# Patient Record
Sex: Male | Born: 1980
Health system: Southern US, Community
[De-identification: ages and names within clinical notes are randomized; demographics above are authoritative.]

## PROBLEM LIST (undated history)

## (undated) DIAGNOSIS — Z789 Other specified health status: Secondary | ICD-10-CM

## (undated) HISTORY — DX: Other specified health status: Z78.9

---
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
## 2018-12-15 DIAGNOSIS — Z119 Encounter for screening for infectious and parasitic diseases, unspecified: Secondary | ICD-10-CM | POA: Diagnosis not present

## 2018-12-15 DIAGNOSIS — Z3141 Encounter for fertility testing: Secondary | ICD-10-CM | POA: Diagnosis not present

## 2019-06-11 DIAGNOSIS — B349 Viral infection, unspecified: Secondary | ICD-10-CM | POA: Diagnosis not present

## 2019-06-11 DIAGNOSIS — Z20828 Contact with and (suspected) exposure to other viral communicable diseases: Secondary | ICD-10-CM | POA: Diagnosis not present

## 2019-06-19 DIAGNOSIS — B9689 Other specified bacterial agents as the cause of diseases classified elsewhere: Secondary | ICD-10-CM | POA: Diagnosis not present

## 2019-06-19 DIAGNOSIS — J208 Acute bronchitis due to other specified organisms: Secondary | ICD-10-CM | POA: Diagnosis not present

## 2019-06-19 DIAGNOSIS — Z7189 Other specified counseling: Secondary | ICD-10-CM | POA: Diagnosis not present

## 2019-06-23 ENCOUNTER — Telehealth: Payer: Self-pay

## 2019-06-23 NOTE — Telephone Encounter (Signed)
It's 21 days from when his symptoms first started so should be coming up on that date  w/in  a week and fine to schedule him At that point but not sooner per ID recs Ninetta Lights)

## 2019-06-23 NOTE — Telephone Encounter (Signed)
LMTCB

## 2019-06-23 NOTE — Telephone Encounter (Signed)
Message routed to [facility],  I called the patient back and he stated he relocated here from [place] and does not have an PCP yet. He wanted to be seen by our office to make sure that his lungs are ok since his covid diagnosis.  I did make him aware that per Dr. Sherene Sires, he would not be able to be seen until 21 days after his diagnosis. The patient voiced understanding of this. I tried to set him up with Dr. Sherene Sires, but he did not have anything available until February 2nd. The patient asked if he can be scheduled to be seen by one of the other doctors here. I told him that could be done, but to keep in mind that our doctors are also providing care for the covid patients so appointments may be limited but we will do what we can to get him scheduled. I asked him to contact his PCP in [place] to request his recent notes, labs, etc are faxed to our office for the upcoming appointment. Patient given our fax number and for it to be sent to the attention of "Patrice", he voiced understanding.  Patrice, how far out are the consults being scheduled?

## 2019-06-23 NOTE — Telephone Encounter (Signed)
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

## 2019-06-28 NOTE — Telephone Encounter (Signed)
Consult for feb 2nd wasn't made. Called and spoke to pt. First available is Feb 3rd with Dr. Sherene Sires. Consult appt made for pt. Pt aware of address. Nothing further needed at this time. Will sign off.

## 2019-07-21 ENCOUNTER — Ambulatory Visit: Payer: BC Managed Care – PPO | Admitting: Internal Medicine

## 2019-07-21 ENCOUNTER — Encounter: Payer: Self-pay | Admitting: Internal Medicine

## 2019-07-21 ENCOUNTER — Other Ambulatory Visit: Payer: Self-pay

## 2019-07-21 ENCOUNTER — Ambulatory Visit (INDEPENDENT_AMBULATORY_CARE_PROVIDER_SITE_OTHER): Payer: BC Managed Care – PPO

## 2019-07-21 DIAGNOSIS — U071 COVID-19: Secondary | ICD-10-CM

## 2019-07-21 DIAGNOSIS — J4521 Mild intermittent asthma with (acute) exacerbation: Secondary | ICD-10-CM | POA: Diagnosis not present

## 2019-07-21 DIAGNOSIS — R05 Cough: Secondary | ICD-10-CM | POA: Diagnosis not present

## 2019-07-21 MED ORDER — TRAMADOL HCL 50 MG PO TABS: 50.0000 mg | ORAL_TABLET | ORAL | 0 refills | Status: AC | PRN

## 2019-07-21 MED ORDER — BUDESONIDE-FORMOTEROL FUMARATE 80-4.5 MCG/ACT IN AERO: INHALATION_SPRAY | RESPIRATORY_TRACT | 12 refills | Status: DC

## 2019-07-21 NOTE — Progress Notes (Addendum)
David Keller, male    DOB: 06-25-1980, 39 y.o.   MRN: [id number]   Brief patient profile:  38 yobm never smoker / pace maker rep  asthma as child and used saba mostly prn with ex and for springtime flares assoc with rhinitis with allergy w/u "only positive for shellfish"and no maint rx and no limits then acute onset Dec 23rd= chills then fever then aches then cough and sob with Pos covid testing rx zpak / prednisone/ saba  And all better x for the cough so self referred to pulmonary clinic 07/21/2019      History of Present Illness  07/21/2019  Pulmonary/ 1st office eval/David Keller  Chief Complaint  Patient presents with  . Pulmonary Consult    Self referral- had covid dx 06/09/2019- since then dry cough, esp worse at night. He has also noticed some wheezing.  He is using an albuterol inhaler about 2 x per wk.   Dyspnea:  MMRC1 = can walk nl pace, flat grade, can't hurry or go uphills or steps s sob   Cough: worse p gets home in evening, never productive / no assoc rhinitis symtoms and worse with laughing, can be severe to point of chest discomfort from coughing fits  Sleep: settles down hs and able to lie flat SABA use: last used 3 days and helps but makes him too shaky to use regulalry   No obvious day to day or daytime variability or assoc excess/ purulent sputum or mucus plugs or hemoptysis or   chest tightness, subjective wheeze or overt sinus or hb symptoms.   Sleeping ok  without nocturnal  or early am exacerbation  of respiratory  c/o's or need for noct saba. Also denies any obvious fluctuation of symptoms with weather or environmental changes or other aggravating or alleviating factors except as outlined above   No unusual exposure hx or h/o childhood pna  or knowledge of premature birth.  Current Allergies, Complete Past Medical History, Past Surgical History, Family History, and Social History were reviewed in David Keller record.  ROS  The following are not  active complaints unless bolded Hoarseness, sore throat, dysphagia, dental problems, itching, sneezing,  nasal congestion or discharge of excess mucus or purulent secretions, ear ache,   fever, chills, sweats, unintended wt loss or wt gain, classically pleuritic or exertional cp,  orthopnea pnd or arm/hand swelling  or leg swelling, presyncope, palpitations, abdominal pain, anorexia, nausea, vomiting, diarrhea  or change in bowel habits or change in bladder habits, change in stools or change in urine, dysuria, hematuria,  rash, arthralgias, visual complaints, headache, numbness, weakness or ataxia or problems with walking or coordination,  change in mood or  memory.             No past medical history on file.  Outpatient Medications Prior to Visit  Medication Sig Dispense Refill  . albuterol (VENTOLIN HFA) 108 (90 Base) MCG/ACT inhaler Inhale 2 puffs into the lungs every 6 (six) hours as needed for wheezing or shortness of breath.    . Multiple Vitamin (MULTIVITAMIN) tablet Take 1 tablet by mouth daily.           Objective:     BP 118/76 (BP Location: Left Arm, Cuff Size: Normal)   Pulse 73   Temp (!) 97.2 F (36.2 C) (Temporal)   Ht 5\' 8"  (1.727 m)   Wt 187 lb (84.8 kg)   SpO2 100%   BMI 28.43 kg/m   amb bm nad  on RA   HEENT : pt wearing mask not removed for exam due to covid -19 concerns.    NECK :  without JVD/Nodes/TM/ nl carotid upstrokes bilaterally   LUNGS: no acc muscle use,  Nl contour chest which is clear to A and P bilaterally with cough  @ end exp maneuvers   CV:  RRR  no s3 or murmur or increase in P2, and no edema   ABD:  soft and nontender with nl inspiratory excursion in the supine position. No bruits or organomegaly appreciated, bowel sounds nl  MS:  Nl gait/ ext warm without deformities, calf tenderness, cyanosis or clubbing No obvious joint restrictions   SKIN: warm and dry without lesions    NEURO:  alert, approp, nl sensorium with  no motor or  cerebellar deficits apparent.     CXR PA and Lateral:   07/21/2019 :    I personally reviewed images and   impression as follows:    mild increase marking non-specific s convincing as dz     Assessment   COVID-19 virus infection Onset of symptoms Jun 09 2019 with persistent cough  - cyclical cough rx 07/19/1939   Of the three most common causes of  Sub-acute / recurrent or chronic cough, only one (GERD)  can actually contribute to/ trigger  the other two (asthma and post nasal drip syndrome)  and perpetuate the cylce of cough.  While not intuitively obvious, many patients with chronic low grade reflux do not cough until there is a primary insult that disturbs the protective epithelial barrier and exposes sensitive nerve endings.   This is typically viral but can due to PNDS and  either may apply here.     >>>The point is that once this occurs, it is difficult to eliminate the cycle  using anything but a maximally effective acid suppression regimen at least in the short run, accompanied by an appropriate diet to address non acid GERD and control / eliminate the cough itself for at least 3 days with tramadol and regroup in 2 weeks if not 100% better      Mild intermittent asthmatic bronchitis with exacerbation Onset in childhood with allergy testing only pos for shellfish - 07/21/2019  After extensive coaching inhaler device,  effectiveness =    75%    Cough at end exp is typical of cough from asthma but can't tol saba or pred well and is very mild so will just try symb 80 1-2 bid prn Based on two studies from NEJM  378; 20 p 1865 (2018) and 380 : p2020-30 (2019) in pts with mild asthma it is reasonable to use low dose symbicort eg 80 2bid "prn" flare in this setting but I emphasized this was only shown with symbicort and takes advantage of the rapid onset of action but is not the same as "rescue therapy" but can be stopped once the acute symptoms have resolved and the need for rescue has been  minimized (< 2 x weekly)     Pulmonary f/u is prn    Total time devoted to counseling  > 50 % of initial 45 min office visit:  review case with pt/I performed device teaching  using a teach back technique which also  extended face to face time for this visit (see above) discussion of options/alternatives/ personally creating written customized instructions  in presence of pt  then going over those specific  Instructions directly with the pt including how to use all of the  meds but in particular covering each new medication in detail and the difference between the maintenance= "automatic" meds and the prns using an action plan format for the latter (If this problem/symptom => do that organization reading Left to right).  Please see AVS from this visit for a full list of these instructions which I personally wrote for this pt and  are unique to this visit.   Sandrea Hughs, MD 07/21/2019

## 2019-07-21 NOTE — Patient Instructions (Addendum)
Symbicort 80 one twice daily   Work on inhaler technique:  relax and gently blow all the way out then take a nice smooth deep breath back in, triggering the inhaler at same time you start breathing in.  Hold for up to 5 seconds if you can. Blow out thru nose. Rinse and gargle with water when done  Only use your albuterol as a rescue medication to be used if you can't catch your breath by resting or doing a relaxed purse lip breathing pattern.  - The less you use it, the better it will work when you need it. - Ok to use up to 1- 2 puffs  every 4 hours if you must but call for immediate appointment if use goes up over your usual need - Don't leave home without it !!  (think of it like the spare tire for your car)   Try prilosec otc 20mg   Take 30- 60 min before your first and last meals of the day until cough is gone for a week     GERD (REFLUX)  is an extremely common cause of respiratory symptoms just like yours , many times with no obvious heartburn at all.    It can be treated with medication, but also with lifestyle changes including elevation of the head of your bed (ideally with 6 -8inch blocks under the headboard of your bed),  Smoking cessation, avoidance of late meals, excessive alcohol, and avoid fatty foods, chocolate, peppermint, colas, red wine, and acidic juices such as orange juice.  NO MINT OR MENTHOL PRODUCTS SO NO COUGH DROPS  USE SUGARLESS CANDY INSTEAD (Jolley ranchers or Stover's or Life Savers) or even ice chips will also do - the key is to swallow to prevent all throat clearing. NO OIL BASED VITAMINS - use powdered substitutes.  Avoid fish oil when coughing.    Take delsym two tsp every 12 hours and supplement if needed with  tramadol 50 mg up to 1 every 4 hours to suppress the urge to cough. Swallowing water and/or using ice chips/non mint and menthol containing candies (such as lifesavers or sugarless jolly ranchers) are also effective.  You should rest your voice and avoid  activities that you know make you cough.  Once you have eliminated the cough for 3 straight days try reducing the tramadol first,  then the delsym as tolerated.       Please remember to go to the  x-ray department  for your tests - we will call you with the results when they are available     Call if not all better in 2 weeks

## 2019-07-22 ENCOUNTER — Encounter: Payer: Self-pay | Admitting: Internal Medicine

## 2019-07-22 DIAGNOSIS — J4521 Mild intermittent asthma with (acute) exacerbation: Secondary | ICD-10-CM | POA: Insufficient documentation

## 2019-07-22 NOTE — Progress Notes (Signed)
Called and left detailed msg on machine with results

## 2019-07-22 NOTE — Assessment & Plan Note (Signed)
Onset of symptoms Jun 09 2019 with persistent cough  - cyclical cough rx 07/21/2019   Of the three most common causes of  Sub-acute / recurrent or chronic cough, only one (GERD)  can actually contribute to/ trigger  the other two (asthma and post nasal drip syndrome)  and perpetuate the cylce of cough.  While not intuitively obvious, many patients with chronic low grade reflux do not cough until there is a primary insult that disturbs the protective epithelial barrier and exposes sensitive nerve endings.   This is typically viral but can due to PNDS and  either may apply here.     >>>The point is that once this occurs, it is difficult to eliminate the cycle  using anything but a maximally effective acid suppression regimen at least in the short run, accompanied by an appropriate diet to address non acid GERD and control / eliminate the cough itself for at least 3 days with tramadol and regroup in 2 weeks if not 100% better

## 2019-07-22 NOTE — Assessment & Plan Note (Signed)
Onset in childhood with allergy testing only pos for shellfish - 07/21/2019  After extensive coaching inhaler device,  effectiveness =    75%    Cough at end exp is typical of cough from asthma but can't tol saba or pred well and is very mild so will just try symb 80 1-2 bid prn Based on two studies from NEJM  378; 20 p 1865 (2018) and 380 : p2020-30 (2019) in pts with mild asthma it is reasonable to use low dose symbicort eg 80 2bid "prn" flare in this setting but I emphasized this was only shown with symbicort and takes advantage of the rapid onset of action but is not the same as "rescue therapy" but can be stopped once the acute symptoms have resolved and the need for rescue has been minimized (< 2 x weekly)     Pulmonary f/u is prn    Total time devoted to counseling  > 50 % of initial 45 min office visit:  review case with pt/I performed device teaching  using a teach back technique which also  extended face to face time for this visit (see above) discussion of options/alternatives/ personally creating written customized instructions  in presence of pt  then going over those specific  Instructions directly with the pt including how to use all of the meds but in particular covering each new medication in detail and the difference between the maintenance= "automatic" meds and the prns using an action plan format for the latter (If this problem/symptom => do that organization reading Left to right).  Please see AVS from this visit for a full list of these instructions which I personally wrote for this pt and  are unique to this visit.

## 2019-12-02 DIAGNOSIS — Z13220 Encounter for screening for lipoid disorders: Secondary | ICD-10-CM | POA: Diagnosis not present

## 2019-12-02 DIAGNOSIS — Z0000 Encounter for general adult medical examination without abnormal findings: Secondary | ICD-10-CM | POA: Diagnosis not present

## 2021-09-04 IMAGING — DX DG CHEST 2V
2 series · 2 of 2 positions shown · non-contrast
Comparison: None.

CLINICAL DATA: Cough.  Recent KNO2A-VY virus infection.

EXAM:
CHEST - 2 VIEW

[chest pa]
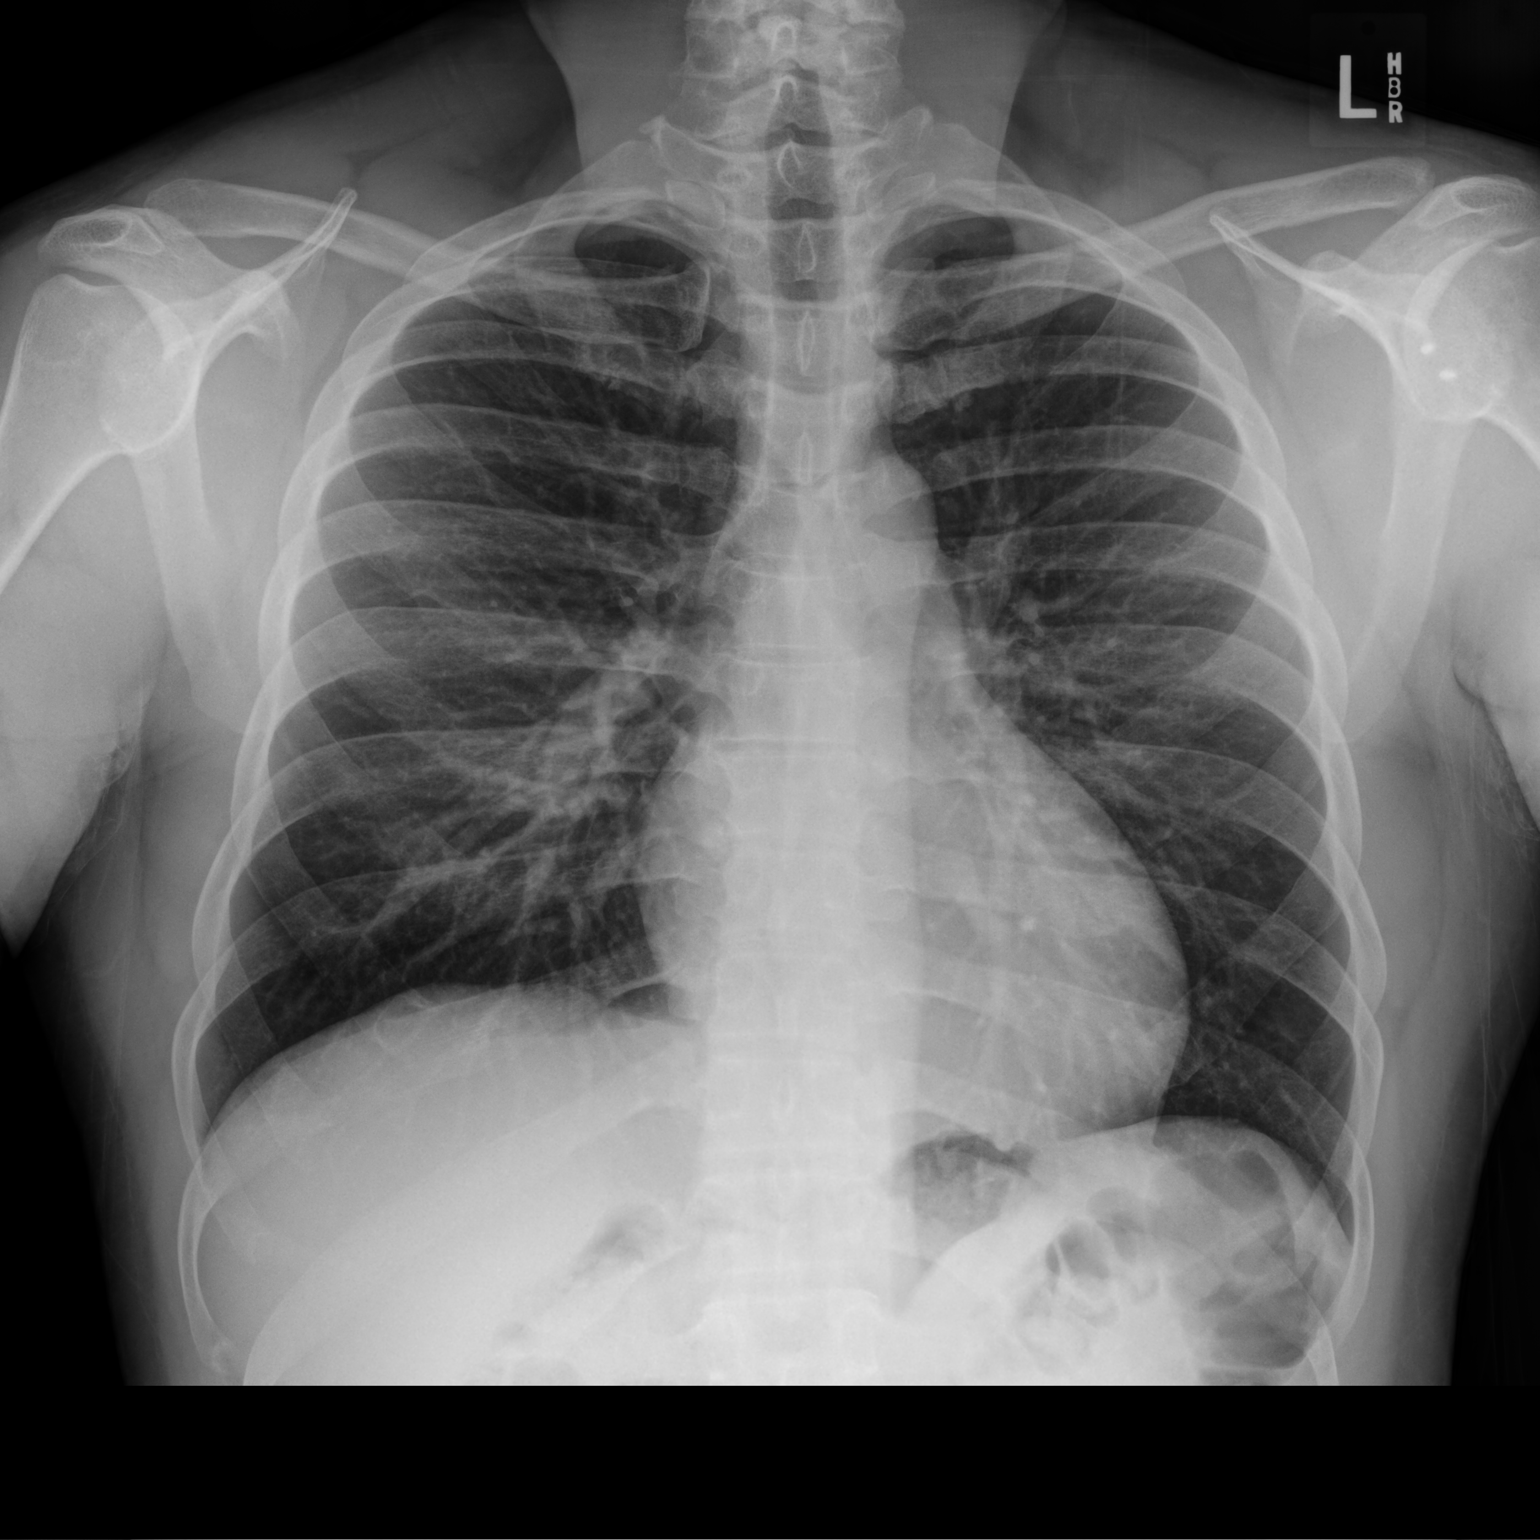

[chest lat]
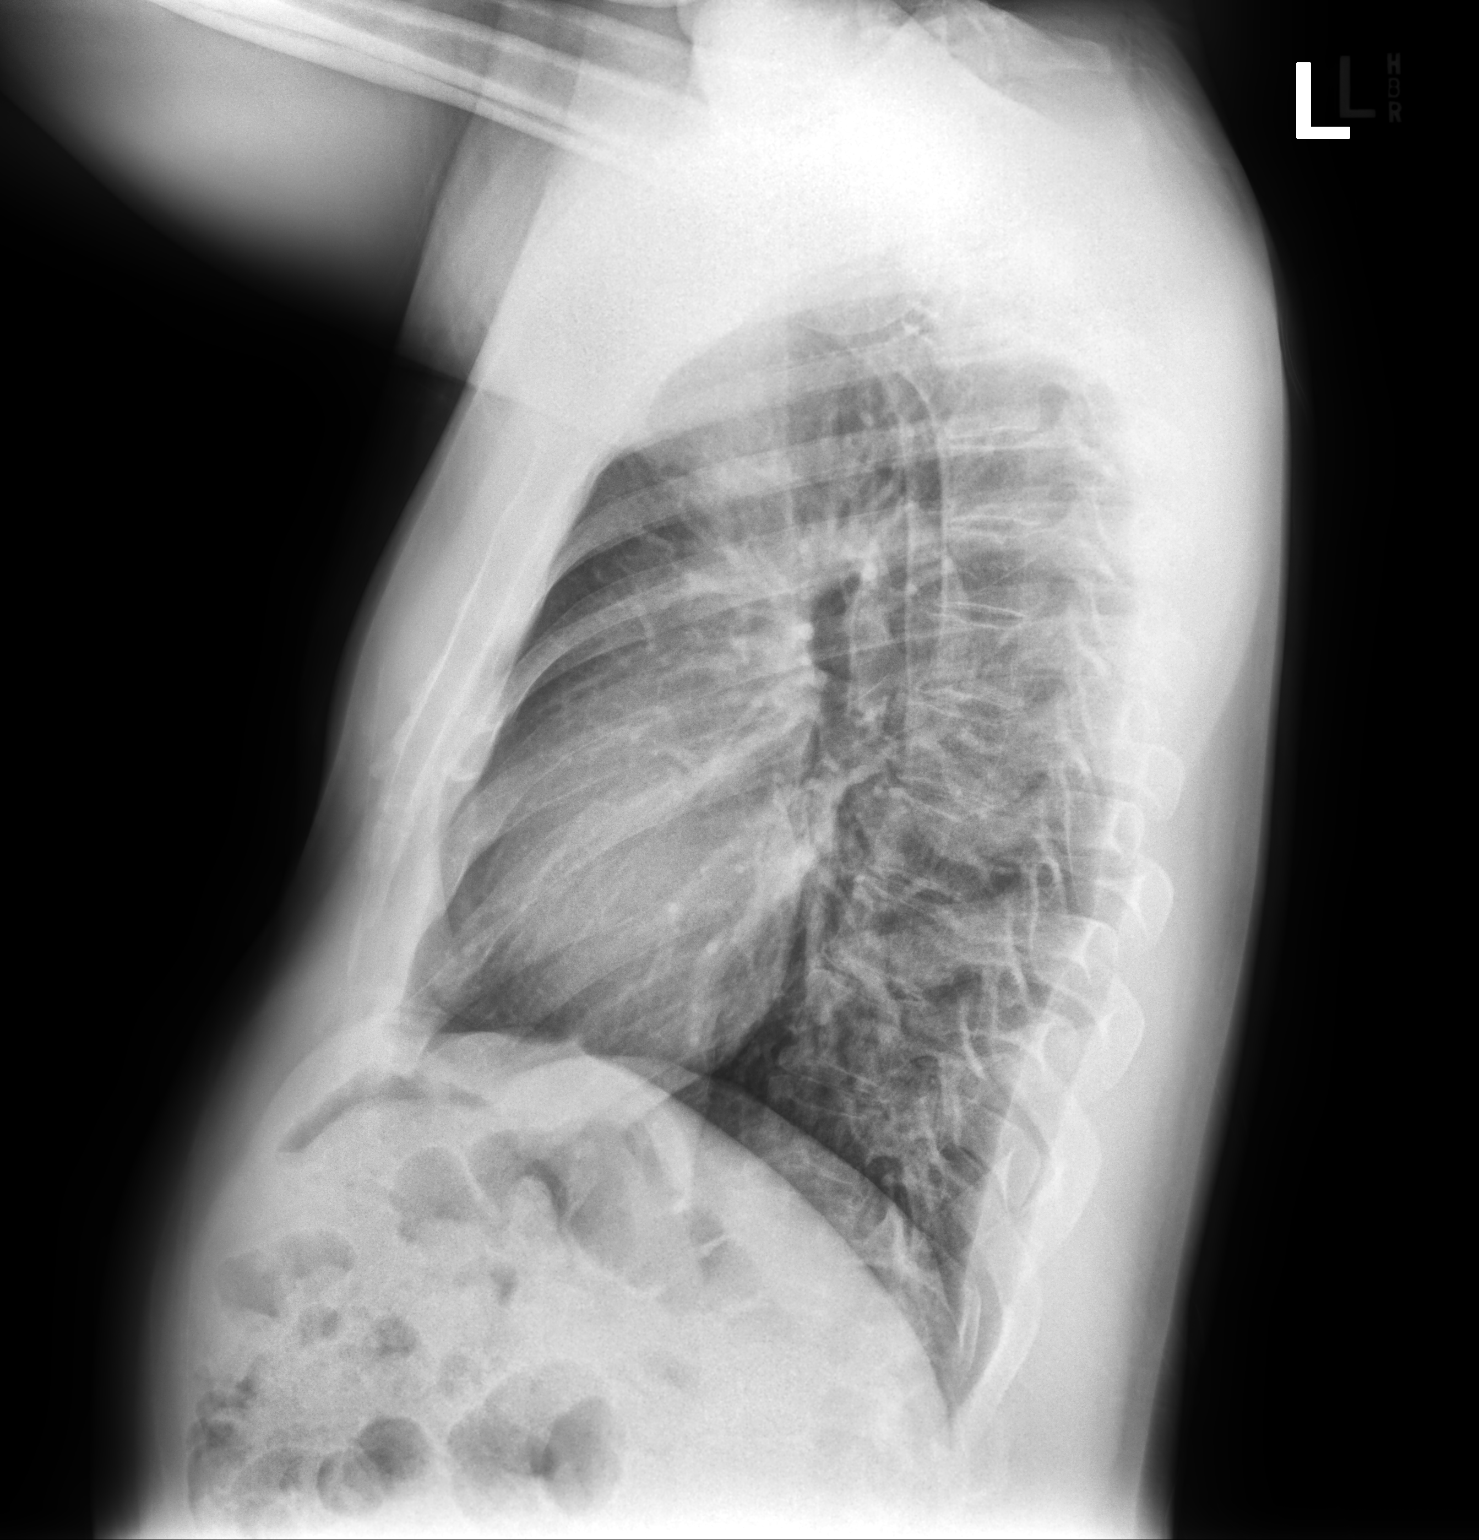

[2 of 2 positions shown; findings below may reference images not displayed]

FINDINGS: The heart size and mediastinal contours are within normal limits.
Both lungs are clear. The visualized skeletal structures are
unremarkable.
IMPRESSION: No active cardiopulmonary disease.

## 2022-12-11 ENCOUNTER — Ambulatory Visit (INDEPENDENT_AMBULATORY_CARE_PROVIDER_SITE_OTHER): Payer: No Typology Code available for payment source | Admitting: Family Medicine

## 2022-12-11 ENCOUNTER — Encounter: Payer: Self-pay | Admitting: Family Medicine

## 2022-12-11 VITALS — BP 120/78 | HR 80 | Temp 98.7°F | Ht 68.0 in | Wt 190.5 lb

## 2022-12-11 DIAGNOSIS — Z0000 Encounter for general adult medical examination without abnormal findings: Secondary | ICD-10-CM | POA: Diagnosis not present

## 2022-12-11 NOTE — Patient Instructions (Addendum)
Give [place] 2-3 business days to get the results of your labs back.   Keep the diet clean and stay active.  Consider super setting with weight lifting.   Consider longer periods of cardio 20-60 minutes.   Please get me a copy of your advanced directive form at your convenience.   Let us know if you need anything.

## 2022-12-11 NOTE — Progress Notes (Signed)
Chief Complaint  Patient presents with   New Patient (Initial Visit)    Would like a CPE if time    Well Male David Keller is here for a complete physical.   His last physical was >1 year ago.  Current diet: in general, a "healthy" diet.   Current exercise: cardio and wt lifting Weight trend: stable Fatigue out of ordinary? No. Seat belt? Yes.   Advanced directive? No  Health maintenance Tetanus- Yes HIV- Yes Hep C- Yes  Past Medical History:  Diagnosis Date   No known health problems      Past Surgical History:  Procedure Laterality Date   OTHER SURGICAL HISTORY Left 2002   L shoulder repair    Medications  Current Outpatient Medications on File Prior to Visit  Medication Sig Dispense Refill   albuterol (VENTOLIN HFA) 108 (90 Base) MCG/ACT inhaler Inhale 2 puffs into the lungs every 6 (six) hours as needed for wheezing or shortness of breath.     cephALEXin (KEFLEX) 500 MG capsule Take 500 mg by mouth 3 (three) times daily.     Allergies Allergies  Allergen Reactions   Shellfish Allergy Anaphylaxis    Family History Family History  Problem Relation Age of Onset   Hypertension Mother    Epilepsy Sister    Hypertension Maternal Grandmother    Heart disease Maternal Grandmother    Diabetes Maternal Grandmother    Prostate cancer Maternal Grandfather 7   Colon cancer Neg Hx     Review of Systems: Constitutional: no fevers or chills Eye:  no recent significant change in vision Ear/Nose/Mouth/Throat:  Ears:  no hearing loss Nose/Mouth/Throat:  no complaints of nasal congestion, no sore throat Cardiovascular:  no chest pain Respiratory:  no shortness of breath Gastrointestinal:  no abdominal pain, no change in bowel habits GU:  Male: negative for dysuria, frequency, and incontinence Musculoskeletal/Extremities:  no pain of the joints Integumentary (Skin/Breast):  no abnormal skin lesions reported Neurologic:  no headaches Endocrine: No unexpected weight  changes Hematologic/Lymphatic:  no night sweats  Exam BP 120/78 (BP Location: Left Arm, Patient Position: Sitting, Cuff Size: Normal)   Pulse 80   Temp 98.7 F (37.1 C) (Oral)   Ht 5\' 8"  (1.727 m)   Wt 190 lb 8 oz (86.4 kg)   SpO2 97%   BMI 28.97 kg/m  General:  well developed, well nourished, in no apparent distress Skin:  no significant moles, warts, or growths Head:  no masses, lesions, or tenderness Eyes:  pupils equal and round, sclera anicteric without injection Ears:  canals without lesions, TMs shiny without retraction, no obvious effusion, no erythema Nose:  nares patent, mucosa normal Throat/Pharynx:  lips and gingiva without lesion; tongue and uvula midline; non-inflamed pharynx; no exudates or postnasal drainage Neck: neck supple without adenopathy, thyromegaly, or masses Lungs:  clear to auscultation, breath sounds equal bilaterally, no respiratory distress Cardio:  regular rate and rhythm, no bruits, no LE edema Abdomen:  abdomen soft, nontender; bowel sounds normal; no masses or organomegaly Rectal: Deferred Musculoskeletal:  symmetrical muscle groups noted without atrophy or deformity Extremities:  no clubbing, cyanosis, or edema, no deformities, no skin discoloration Neuro:  gait normal; deep tendon reflexes normal and symmetric Psych: well oriented with normal range of affect and appropriate judgment/insight  Assessment and Plan  Well adult exam - Plan: CBC, Comprehensive metabolic panel, Lipid panel   Well 42 y.o. male. Counseled on diet and exercise. Counseled on risks and benefits of prostate cancer  screening with PSA. The patient agrees to forego screening. Will likely start at 47.  Advanced directive form provided today.  Other orders as above. Follow up in 1 yr pending the above workup. The patient voiced understanding and agreement to the plan.  Jilda Roche [place], DO 12/11/22 4:22 PM

## 2022-12-12 LAB — COMPREHENSIVE METABOLIC PANEL
ALT: 15 U/L (ref 0–53)
AST: 19 U/L (ref 0–37)
Albumin: 4.4 g/dL (ref 3.5–5.2)
Alkaline Phosphatase: 65 U/L (ref 39–117)
BUN: 11 mg/dL (ref 6–23)
CO2: 32 mEq/L (ref 19–32)
Calcium: 9.4 mg/dL (ref 8.4–10.5)
Chloride: 102 mEq/L (ref 96–112)
Creatinine, Ser: 1.04 mg/dL (ref 0.40–1.50)
GFR: 89.92 mL/min (ref >60.00)
Glucose, Bld: 85 mg/dL (ref 70–99)
Potassium: 3.9 mEq/L (ref 3.5–5.1)
Sodium: 140 mEq/L (ref 135–145)
Total Bilirubin: 0.4 mg/dL (ref 0.2–1.2)
Total Protein: 7.6 g/dL (ref 6.0–8.3)

## 2022-12-12 LAB — LIPID PANEL
Cholesterol: 139 mg/dL (ref 0–200)
HDL: 44.6 mg/dL (ref >39.00)
LDL Cholesterol: 72 mg/dL (ref 0–99)
NonHDL: 94.56
Total CHOL/HDL Ratio: 3
Triglycerides: 115 mg/dL (ref 0.0–149.0)
VLDL: 23 mg/dL (ref 0.0–40.0)

## 2022-12-12 LAB — CBC
HCT: 39.3 % (ref 39.0–52.0)
Hemoglobin: 12.7 g/dL — ABNORMAL LOW (ref 13.0–17.0)
MCHC: 32.2 g/dL (ref 30.0–36.0)
MCV: 83.1 fl (ref 78.0–100.0)
Platelets: 250 10*3/uL (ref 150.0–400.0)
RBC: 4.73 Mil/uL (ref 4.22–5.81)
RDW: 13.8 % (ref 11.5–15.5)
WBC: 4.5 10*3/uL (ref 4.0–10.5)

## 2022-12-13 ENCOUNTER — Ambulatory Visit (INDEPENDENT_AMBULATORY_CARE_PROVIDER_SITE_OTHER): Payer: No Typology Code available for payment source

## 2022-12-13 ENCOUNTER — Telehealth: Payer: Self-pay

## 2022-12-13 ENCOUNTER — Other Ambulatory Visit: Payer: Self-pay

## 2022-12-13 DIAGNOSIS — D649 Anemia, unspecified: Secondary | ICD-10-CM

## 2022-12-13 DIAGNOSIS — Z0000 Encounter for general adult medical examination without abnormal findings: Secondary | ICD-10-CM | POA: Diagnosis not present

## 2022-12-13 LAB — IBC + FERRITIN
Ferritin: 131.3 ng/mL (ref 22.0–322.0)
Iron: 65 ug/dL (ref 42–165)
Saturation Ratios: 24.4 % (ref 20.0–50.0)
TIBC: 266 ug/dL (ref 250.0–450.0)
Transferrin: 190 mg/dL — ABNORMAL LOW (ref 212.0–360.0)

## 2022-12-13 NOTE — Telephone Encounter (Signed)
Left message for patient to call and schedule lab appointment labs are in future

## 2022-12-13 NOTE — Progress Notes (Addendum)
Left message for patient to call office to schedule lab appointment orders are in future.

## 2023-02-19 ENCOUNTER — Other Ambulatory Visit (HOSPITAL_COMMUNITY)
Admission: RE | Admit: 2023-02-19 | Discharge: 2023-02-19 | Disposition: A | Discharge status: Home / Self Care | Payer: No Typology Code available for payment source | Source: Ambulatory Visit | Attending: Oncology | Admitting: Oncology

## 2023-02-19 ENCOUNTER — Other Ambulatory Visit: Payer: Self-pay | Admitting: Medical Genetics

## 2023-02-19 DIAGNOSIS — Z006 Encounter for examination for normal comparison and control in clinical research program: Secondary | ICD-10-CM

## 2023-03-04 LAB — GENECONNECT MOLECULAR SCREEN: Genetic Analysis Overall Interpretation: NEGATIVE

## 2023-12-12 ENCOUNTER — Encounter: Payer: No Typology Code available for payment source | Admitting: Family Medicine

## 2024-01-07 ENCOUNTER — Encounter: Admitting: Family Medicine

## 2024-03-16 ENCOUNTER — Ambulatory Visit: Payer: Self-pay | Admitting: Family Medicine

## 2024-03-16 ENCOUNTER — Encounter: Payer: Self-pay | Admitting: Family Medicine

## 2024-03-16 ENCOUNTER — Ambulatory Visit (INDEPENDENT_AMBULATORY_CARE_PROVIDER_SITE_OTHER): Admitting: Family Medicine

## 2024-03-16 VITALS — BP 122/80 | HR 100 | Temp 94.4°F | Resp 16 | Ht 68.0 in | Wt 195.4 lb

## 2024-03-16 DIAGNOSIS — Z0000 Encounter for general adult medical examination without abnormal findings: Secondary | ICD-10-CM | POA: Diagnosis not present

## 2024-03-16 DIAGNOSIS — Z23 Encounter for immunization: Secondary | ICD-10-CM

## 2024-03-16 LAB — CBC
HCT: 40.8 % (ref 39.0–52.0)
Hemoglobin: 13.4 g/dL (ref 13.0–17.0)
MCHC: 32.9 g/dL (ref 30.0–36.0)
MCV: 81.5 fl (ref 78.0–100.0)
Platelets: 238 K/uL (ref 150.0–400.0)
RBC: 5 Mil/uL (ref 4.22–5.81)
RDW: 14.1 % (ref 11.5–15.5)
WBC: 3.6 K/uL — ABNORMAL LOW (ref 4.0–10.5)

## 2024-03-16 LAB — LIPID PANEL
Cholesterol: 186 mg/dL (ref 0–200)
HDL: 46.4 mg/dL (ref >39.00)
LDL Cholesterol: 110 mg/dL — ABNORMAL HIGH (ref 0–99)
NonHDL: 139.17
Total CHOL/HDL Ratio: 4
Triglycerides: 147 mg/dL (ref 0.0–149.0)
VLDL: 29.4 mg/dL (ref 0.0–40.0)

## 2024-03-16 LAB — COMPREHENSIVE METABOLIC PANEL WITH GFR
ALT: 15 U/L (ref 0–53)
AST: 17 U/L (ref 0–37)
Albumin: 4.7 g/dL (ref 3.5–5.2)
Alkaline Phosphatase: 69 U/L (ref 39–117)
BUN: 11 mg/dL (ref 6–23)
CO2: 33 meq/L — ABNORMAL HIGH (ref 19–32)
Calcium: 9.6 mg/dL (ref 8.4–10.5)
Chloride: 104 meq/L (ref 96–112)
Creatinine, Ser: 0.96 mg/dL (ref 0.40–1.50)
GFR: 98.11 mL/min (ref >60.00)
Glucose, Bld: 92 mg/dL (ref 70–99)
Potassium: 4.5 meq/L (ref 3.5–5.1)
Sodium: 142 meq/L (ref 135–145)
Total Bilirubin: 0.5 mg/dL (ref 0.2–1.2)
Total Protein: 7.7 g/dL (ref 6.0–8.3)

## 2024-03-16 MED ORDER — AIRSUPRA 90-80 MCG/ACT IN AERO: 2.0000 | INHALATION_SPRAY | Freq: Four times a day (QID) | RESPIRATORY_TRACT | 2 refills | Status: AC | PRN

## 2024-03-16 NOTE — Patient Instructions (Signed)
 Give David Keller 2-3 business days to get the results of your labs back.   Keep the diet clean and stay active.  Please get me a copy of your advanced directive form at your convenience.   Let David Keller know if you need anything.

## 2024-03-16 NOTE — Progress Notes (Signed)
 Chief Complaint  Patient presents with   Annual Exam    CPE    Well Male David Keller is here for a complete physical.   His last physical was >1 year ago.  Current diet: in general, a healthy diet.   Current exercise: sometimes lifts wts, incline jogging Weight trend: stable Fatigue out of ordinary? No. Seat belt? Yes.   Advanced directive? No  Health maintenance Tetanus- Yes HIV- Yes Hep C- Yes  Past Medical History:  Diagnosis Date   No known health problems      Past Surgical History:  Procedure Laterality Date   OTHER SURGICAL HISTORY Left 2002   L shoulder repair    Medications  Current Outpatient Medications on File Prior to Visit  Medication Sig Dispense Refill   albuterol (VENTOLIN HFA) 108 (90 Base) MCG/ACT inhaler Inhale 2 puffs into the lungs every 6 (six) hours as needed for wheezing or shortness of breath.      Allergies Allergies  Allergen Reactions   Shellfish Allergy Anaphylaxis    Family History Family History  Problem Relation Age of Onset   Hypertension Mother    Epilepsy Sister    Hypertension Maternal Grandmother    Heart disease Maternal Grandmother    Diabetes Maternal Grandmother    Prostate cancer Maternal Grandfather 60   Colon cancer Neg Hx     Review of Systems: Constitutional: no fevers or chills Eye:  no recent significant change in vision Ear/Nose/Mouth/Throat:  Ears:  no hearing loss Nose/Mouth/Throat:  no complaints of nasal congestion, no sore throat Cardiovascular:  no chest pain Respiratory:  no shortness of breath Gastrointestinal:  no abdominal pain, no change in bowel habits GU:  Male: negative for dysuria, frequency, and incontinence Musculoskeletal/Extremities:  no pain of the joints Integumentary (Skin/Breast):  no abnormal skin lesions reported Neurologic:  no headaches Endocrine: No unexpected weight changes Hematologic/Lymphatic:  no night sweats  Exam BP 122/80 (BP Location: Left Arm, Patient  Position: Sitting)   Pulse 100   Temp (!) 94.4 F (34.7 C) (Temporal)   Resp 16   Ht 5' 8 (1.727 m)   Wt 195 lb 6.4 oz (88.6 kg)   SpO2 95%   BMI 29.71 kg/m  General:  well developed, well nourished, in no apparent distress Skin:  no significant moles, warts, or growths Head:  no masses, lesions, or tenderness Eyes:  pupils equal and round, sclera anicteric without injection Ears:  canals without lesions, TMs shiny without retraction, no obvious effusion, no erythema Nose:  nares patent, mucosa normal Throat/Pharynx:  lips and gingiva without lesion; tongue and uvula midline; non-inflamed pharynx; no exudates or postnasal drainage Neck: neck supple without adenopathy, thyromegaly, or masses Lungs:  clear to auscultation, breath sounds equal bilaterally, no respiratory distress Cardio:  regular rate and rhythm, no bruits, no LE edema Abdomen:  abdomen soft, nontender; bowel sounds normal; no masses or organomegaly Rectal: Deferred Musculoskeletal:  symmetrical muscle groups noted without atrophy or deformity Extremities:  no clubbing, cyanosis, or edema, no deformities, no skin discoloration Neuro:  gait normal; deep tendon reflexes normal and symmetric Psych: well oriented with normal range of affect and appropriate judgment/insight  Assessment and Plan  Well adult exam - Plan: CBC, Comprehensive metabolic panel with GFR, Lipid panel, Hepatitis B surface antibody,quantitative   Well 43 y.o. male. Counseled on diet and exercise. Advanced directive form provided today.  PCV20 today. Gets flu shots at work.  Other orders as above. Follow up in 1 yr  pending the above workup. The patient voiced understanding and agreement to the plan.  Mabel [place], DO 03/16/24 10:44 AM

## 2024-03-17 LAB — HEPATITIS B SURFACE ANTIBODY, QUANTITATIVE: Hep B S AB Quant (Post): <5 m[IU]/mL — ABNORMAL LOW (ref >=10)
